# Patient Record
Sex: Female | Born: 1955 | Race: Black or African American | Hispanic: No | Smoking: Current every day smoker
Health system: Southern US, Community
[De-identification: ages and names within clinical notes are randomized; demographics above are authoritative.]

## PROBLEM LIST (undated history)

## (undated) DIAGNOSIS — K219 Gastro-esophageal reflux disease without esophagitis: Secondary | ICD-10-CM

## (undated) DIAGNOSIS — I10 Essential (primary) hypertension: Secondary | ICD-10-CM

## (undated) DIAGNOSIS — R519 Headache, unspecified: Secondary | ICD-10-CM

## (undated) DIAGNOSIS — F419 Anxiety disorder, unspecified: Secondary | ICD-10-CM

## (undated) DIAGNOSIS — M1990 Unspecified osteoarthritis, unspecified site: Secondary | ICD-10-CM

## (undated) DIAGNOSIS — R7303 Prediabetes: Secondary | ICD-10-CM

## (undated) HISTORY — PX: CERVICAL FUSION: SHX112

## (undated) HISTORY — PX: ABDOMINAL HYSTERECTOMY: SHX81

## (undated) HISTORY — PX: HEMORROIDECTOMY: SUR656

## (undated) HISTORY — PX: BACK SURGERY: SHX140

## (undated) HISTORY — PX: OTHER SURGICAL HISTORY: SHX169

---

## 2003-08-04 ENCOUNTER — Encounter: Admission: RE | Admit: 2003-08-04 | Discharge: 2003-08-04 | Payer: Self-pay | Admitting: Family Medicine

## 2003-08-13 ENCOUNTER — Encounter: Admission: RE | Admit: 2003-08-13 | Discharge: 2003-09-25 | Payer: Self-pay | Admitting: Family Medicine

## 2005-02-10 ENCOUNTER — Ambulatory Visit (HOSPITAL_COMMUNITY): Admission: RE | Admit: 2005-02-10 | Discharge: 2005-02-10 | Payer: Self-pay | Admitting: Gastroenterology

## 2007-01-17 ENCOUNTER — Inpatient Hospital Stay (HOSPITAL_COMMUNITY): Admission: RE | Admit: 2007-01-17 | Discharge: 2007-01-20 | Payer: Self-pay | Admitting: Neurosurgery

## 2007-01-22 ENCOUNTER — Emergency Department (HOSPITAL_COMMUNITY): Admission: EM | Admit: 2007-01-22 | Discharge: 2007-01-22 | Payer: Self-pay | Admitting: Emergency Medicine

## 2009-03-21 ENCOUNTER — Encounter
Admission: RE | Admit: 2009-03-21 | Discharge: 2009-03-21 | Payer: Self-pay | Admitting: Physical Medicine and Rehabilitation

## 2010-02-11 ENCOUNTER — Inpatient Hospital Stay (HOSPITAL_COMMUNITY): Admission: RE | Admit: 2010-02-11 | Discharge: 2010-02-12 | Payer: Self-pay | Admitting: Neurosurgery

## 2010-03-13 ENCOUNTER — Encounter: Admission: RE | Admit: 2010-03-13 | Discharge: 2010-03-13 | Payer: Self-pay | Admitting: Neurosurgery

## 2010-04-10 ENCOUNTER — Encounter: Admission: RE | Admit: 2010-04-10 | Discharge: 2010-04-10 | Payer: Self-pay | Admitting: Neurosurgery

## 2010-05-12 ENCOUNTER — Inpatient Hospital Stay (HOSPITAL_COMMUNITY): Admission: RE | Admit: 2010-05-12 | Discharge: 2010-05-13 | Payer: Self-pay | Admitting: Neurosurgery

## 2010-06-18 ENCOUNTER — Encounter: Admission: RE | Admit: 2010-06-18 | Discharge: 2010-06-18 | Payer: Self-pay | Admitting: Neurosurgery

## 2010-08-27 ENCOUNTER — Ambulatory Visit
Admission: RE | Admit: 2010-08-27 | Discharge: 2010-08-27 | Disposition: A | Discharge status: Home / Self Care | Payer: Medicare Other | Source: Ambulatory Visit | Attending: Neurosurgery | Admitting: Neurosurgery

## 2010-08-27 ENCOUNTER — Other Ambulatory Visit: Payer: Self-pay | Admitting: Neurosurgery

## 2010-08-27 DIAGNOSIS — M545 Low back pain: Secondary | ICD-10-CM

## 2010-10-01 LAB — SURGICAL PCR SCREEN
MRSA, PCR: NEGATIVE
Staphylococcus aureus: NEGATIVE

## 2010-10-01 LAB — BASIC METABOLIC PANEL
Calcium: 10 mg/dL (ref 8.4–10.5)
Chloride: 101 mEq/L (ref 96–112)
Creatinine, Ser: 0.83 mg/dL (ref 0.4–1.2)
GFR calc Af Amer: >60 mL/min (ref >60)

## 2010-10-01 LAB — GLUCOSE, CAPILLARY
Glucose-Capillary: 137 mg/dL — ABNORMAL HIGH (ref 70–99)
Glucose-Capillary: 137 mg/dL — ABNORMAL HIGH (ref 70–99)
Glucose-Capillary: 150 mg/dL — ABNORMAL HIGH (ref 70–99)

## 2010-10-01 LAB — DIFFERENTIAL
Lymphocytes Relative: 28 % (ref 12–46)
Lymphs Abs: 2.3 10*3/uL (ref 0.7–4.0)
Neutrophils Relative %: 63 % (ref 43–77)

## 2010-10-01 LAB — CBC
Platelets: 263 10*3/uL (ref 150–400)
RBC: 4.53 MIL/uL (ref 3.87–5.11)
WBC: 8.2 10*3/uL (ref 4.0–10.5)

## 2010-10-01 LAB — TYPE AND SCREEN: Antibody Screen: NEGATIVE

## 2010-10-04 LAB — GLUCOSE, CAPILLARY
Glucose-Capillary: 106 mg/dL — ABNORMAL HIGH (ref 70–99)
Glucose-Capillary: 131 mg/dL — ABNORMAL HIGH (ref 70–99)
Glucose-Capillary: 193 mg/dL — ABNORMAL HIGH (ref 70–99)

## 2010-10-04 LAB — DIFFERENTIAL
Basophils Absolute: 0.1 10*3/uL (ref 0.0–0.1)
Basophils Relative: 1 % (ref 0–1)
Monocytes Absolute: 0.5 10*3/uL (ref 0.1–1.0)
Neutro Abs: 6.2 10*3/uL (ref 1.7–7.7)

## 2010-10-04 LAB — BASIC METABOLIC PANEL
BUN: 13 mg/dL (ref 6–23)
Calcium: 9.6 mg/dL (ref 8.4–10.5)
GFR calc non Af Amer: 59 mL/min — ABNORMAL LOW (ref >60)
Glucose, Bld: 124 mg/dL — ABNORMAL HIGH (ref 70–99)
Potassium: 3.4 mEq/L — ABNORMAL LOW (ref 3.5–5.1)

## 2010-10-04 LAB — CBC
HCT: 39.1 % (ref 36.0–46.0)
MCHC: 33.8 g/dL (ref 30.0–36.0)
RDW: 14.7 % (ref 11.5–15.5)

## 2010-12-02 NOTE — Op Note (Signed)
[name], [name]             ACCOUNT NO.:  [id number]   MEDICAL RECORD NO.:  [id number]          PATIENT TYPE:  INP   LOCATION:  2899                         FACILITY:  [facility]   PHYSICIAN:  Reinaldo Meeker, M.D. DATE OF BIRTH:  [birth year]   DATE OF PROCEDURE:  01/17/2007  DATE OF DISCHARGE:                               OPERATIVE REPORT   PREOPERATIVE DIAGNOSIS:  Spondylolisthesis with stenosis L5-S1.   POSTOPERATIVE DIAGNOSIS:  Spondylolisthesis with stenosis L5-S1.   PROCEDURE:  L5-S1 decompressive laminectomy followed by bilateral L5-S1  microdiskectomy followed by L5-S1 posterior lumbar interbody fusion with  invasive interbody spacers, followed by nonsegmental instrumentation  with pedicle screw fixation L5-S1, followed by L5-S1 posterolateral  fusion.   SECONDARY PROCEDURE:  Microdissection, L5-S1 disk and L5-S1 nerve roots.   SURGEON:  Reinaldo Meeker, M.D.   ASSISTANT:  Kathaleen Maser. Pool, M.D.   PROCEDURE IN DETAIL:  After being placed in the prone position, the  patient's back was prepped and draped in the usual sterile fashion.  Localizing x-rays were taken prior to incision to identify the  appropriate level.   Midline incision was made over the spinous process of L4, L5, and S1.  Using Bovie cutting current, the incision was carried down the spinous  processes.  Subperiosteal dissection was then carried out bilaterally on  the spinous processes, lamina, and facet joint and into the far lateral  region to identify the transverse process of L5 and the lateral aspects  of the sacrum bilaterally.  Self-retaining retractor was placed for  exposure.  X-rays showed approach at the appropriate level.  Using the  Stille rongeur, the spinous processes and interspinous ligament were  removed.  The spinous process bone was saved for use later in the case.  Starting on the patient's left side, generous laminotomy was performed  by removing the inferior three-quarters of  the L5 lamina, the medial  three-quarters of facet joint, the superior one-half of the S1 segment.  Residual bone was removed and saved for use later in the case.  Ligament  flavum was removed in a piecemeal fashion.  Similar decompression was  carried out on the opposite side.  When this was completed, residual  midline structures were removed to complete the bilateral decompression.  L5 and S1 nerve roots were both identified bilaterally and tracked out  to their foramen generously.  At this time, bilateral microdiskectomy  was carried out using pituitary rongeurs and curettes.  Thorough disk  space clean-out was carried out, and at the same time, great care was  taken to avoid injury to the neural elements.  This was successfully  done.  At this point, inspection was carried out in all directions for  any evidence of residual compression, and none could be identified.  The  disk space was then prepared for posterior lumber interbody fusion.  Distraction up to 10 mm size was carried out.  Rotating cutter followed  by box chisel was carried out, and a 10 x 9 x 20-mm bony spacer was  placed on the first side.  A similar procedure was carried out on  the  opposite side.  Prior to placement of the second bony spacer, autologous  bone, BMP, and __________ were placed in the midline.  Pedicle screw  instrumentation was then placed bilaterally at L5-S1.  Under  fluoroscopic guidance, a drill hole was placed followed by passing the  pedicle awl, tapping, and placing 40-mm screws at L5 and 35-mm screws at  S1.  These were found to be in good position under fluoroscopy.  Large  amounts of irrigation were then carried out.  Any bleeding controlled  with bipolar coagulation and Gelfoam.  The far lateral region was then  decorticated, and posterolateral fusion was performed by placing a  mixture of __________  and autologous bone with BMP.  At this time, an  appropriate length rod was chosen.  S1  screws were secured with their  top-loading nuts bilaterally, and then the L5 screws were secured with  compression of L5 down to S1 bilaterally.  Final fluoroscopy in AP and  lateral direction showed the interbody spacers, screws, and rods to all  be in good position.  Any bleeding on the dura was then controlled with  bipolar coagulation and Gelfoam.  An epidural drain was left in the  epidural space and brought out through a separate incision.  The would  was then closed in multiple layers of Vicryl in the muscle fascia,  subcutaneous and subcuticular tissues, and staples were placed on the  skin.  A sterile dressing was then applied.   The patient was extubated and taken to the recovery room in stable  condition.           ______________________________  Reinaldo Meeker, M.D.     ROK/MEDQ  D:  01/17/2007  T:  01/17/2007  Job:  [id number]

## 2010-12-05 NOTE — Discharge Summary (Signed)
NAME:  Susan [name], Susan [name]             ACCOUNT NO.:  [id number]   MEDICAL RECORD NO.:  [id number]          PATIENT TYPE:  EMS   LOCATION:  MAJO                         FACILITY:  [facility]   PHYSICIAN:  Reinaldo Meeker, M.D. DATE OF BIRTH:  1955-12-11   DATE OF ADMISSION:  01/22/2007  DATE OF DISCHARGE:  01/22/2007                               DISCHARGE SUMMARY   PRIMARY DIAGNOSIS:  Spondylolisthesis with spondylolysis L5-S1.   FINAL DIAGNOSIS:  Spondylolisthesis with spondylolysis L5-S1.   PRIMARY OPERATIVE PROCEDURE:  L5-S1 (PLIF) posterior lumbar interbody  fusion with pedicle screw fixation.   HISTORY:  Susan [name] is a 55 year old female with back and bilateral  lower extremity pain.  She was evaluated with a variety of studies,  which confirmed the presence of spondylolysis of L5 with  spondylolisthesis of L5-S1.  After discussing the options, she wished to  proceed with surgery, and was admitted at this time for posterior lumbar  interbody fusion with pedicle screw fixation.  On January 17, 2007, the  patient was taken to the operating room, where she underwent the above-  mentioned procedure.  She tolerated it well.  Over subsequent days, she  was able to increase her activities, though she did have some diffuse  body discomfort.  By January 20, 2007, however, she was up, ambulating well,  tolerating a regular diet.  Her wound was healing well, her pain was  markedly improved.  It was felt that she could be discharged home.   DISCHARGE MEDICATIONS:  Pain medication.   CONDITION ON DISCHARGE:  Markedly improved versus admission.           ______________________________  Reinaldo Meeker, M.D.     ROK/MEDQ  D:  02/17/2007  T:  02/17/2007  Job:  [id number]

## 2011-05-06 LAB — BASIC METABOLIC PANEL
CO2: 29
Calcium: 9.4
Creatinine, Ser: 0.81
GFR calc Af Amer: >60
GFR calc non Af Amer: >60
Sodium: 138

## 2011-05-06 LAB — TYPE AND SCREEN
ABO/RH(D): A POS
Antibody Screen: NEGATIVE

## 2011-05-06 LAB — CBC
Hemoglobin: 14.1
RBC: 4.66
WBC: 8.8

## 2011-05-06 LAB — ABO/RH: ABO/RH(D): A POS

## 2012-07-11 IMAGING — CT CT L SPINE W/ CM
4 of 11 series · 10 of 33 positions shown, 12 images · IV contrast (omnipaque)
Comparison: No prior cross-sectional [HOSPITAL] this location.

MYELOGRAM INJECTION
TECHNIQUE: Informed consent was obtained from the patient prior to
the procedure, including potential complications of headache,
allergy, infection and pain. Specific instructions were given
regarding 24 hour bedrest post procedure to prevent post-LP
headache.  A timeout procedure was performed.  With the patient
prone, the lower back was prepped with Betadine.  1% Lidocaine was
used for local anesthesia.  Lumbar puncture was performed by the
radiologist at the L4 level using a 22 gauge needle with return of
clear CSF.  15 cc of Omnipaque 180 was injected into the
subarachnoid space .
CLINICAL DATA: Low back pain.  Left leg pain.  Previous L5-S1
discectomy, posterior decompression, posterolateral interbody
fusion augmented with pedicle screw fixation on 01/17/2007.
TECHNIQUE: Multidetector CT imaging of the lumbar spine was
performed following myelography.  Multiplanar CT image
reconstructions were also generated.

[Series 2: l spine bone · axial · 0.27mm/px · z∈[-248,-178]mm · 2 of 86 slices shown, 3 images]
[im 29/86  soft-tissue]
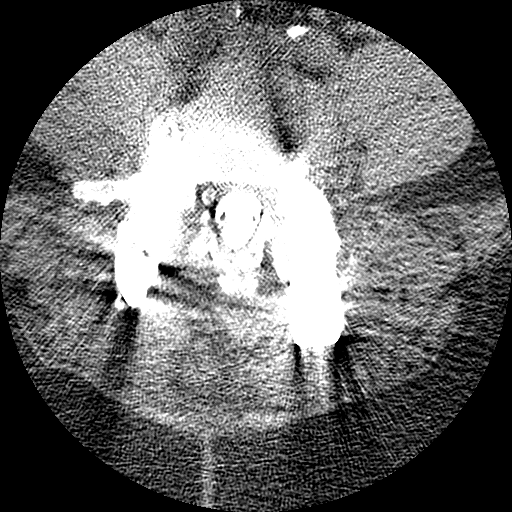
[im 29/86  bone]
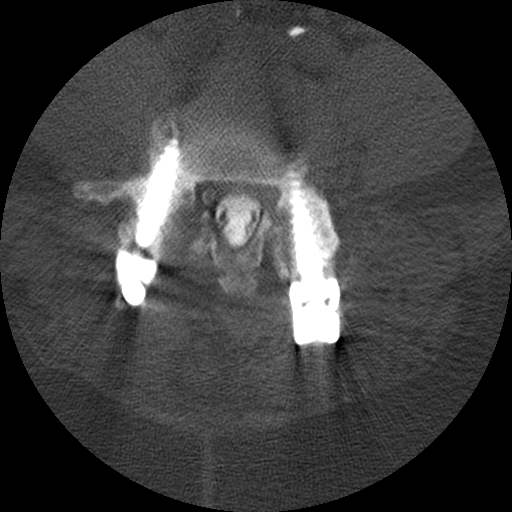
[im 57/86  bone]
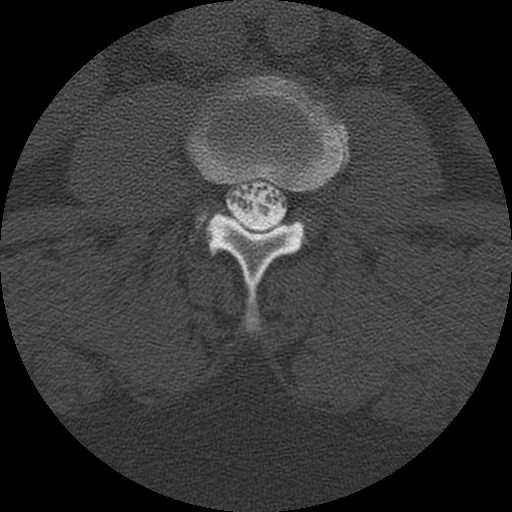

[Series 3: l spine soft · axial · 0.27mm/px · z∈[-248,-178]mm · 2 of 86 slices shown]
[im 29/86  soft-tissue]
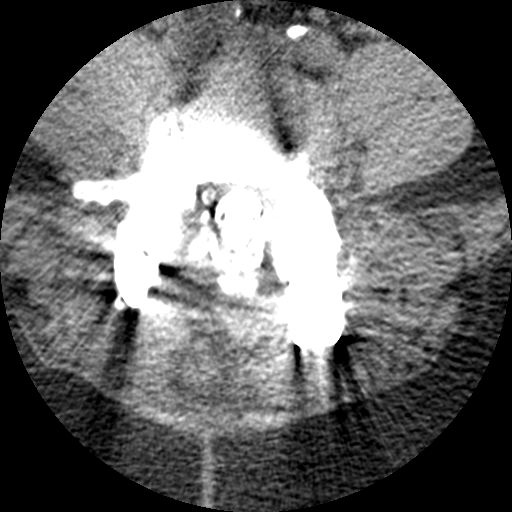
[im 57/86  soft-tissue]
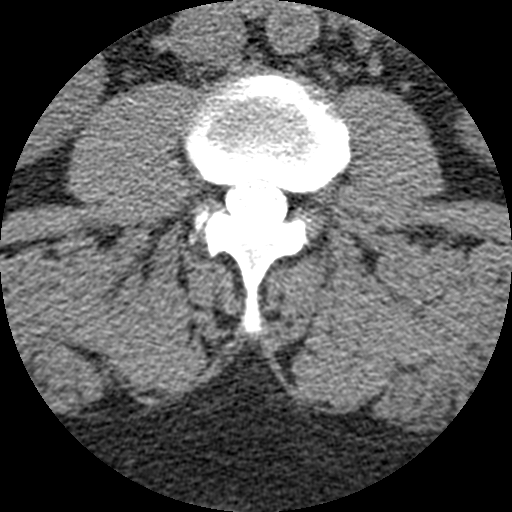

[Series 401: cor lower l-spine · coronal · 0.42mm/px · 1 of 43 slices shown]
[im 22/43  bone]
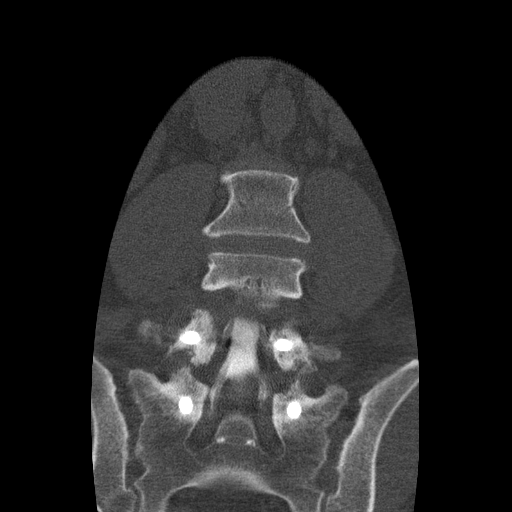

[Series 402: sag · sagittal · 0.42mm/px · 5 of 43 slices shown, 6 images]
[im 15/43  bone]
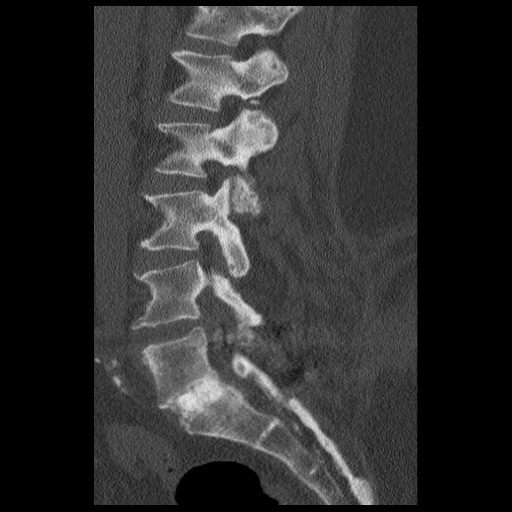
[im 18/43  bone]
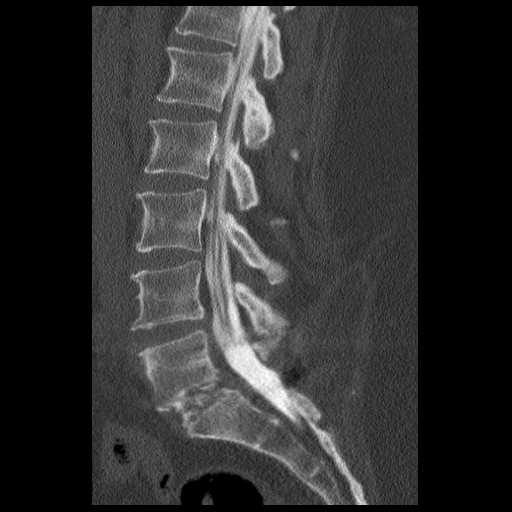
[im 22/43  soft-tissue]
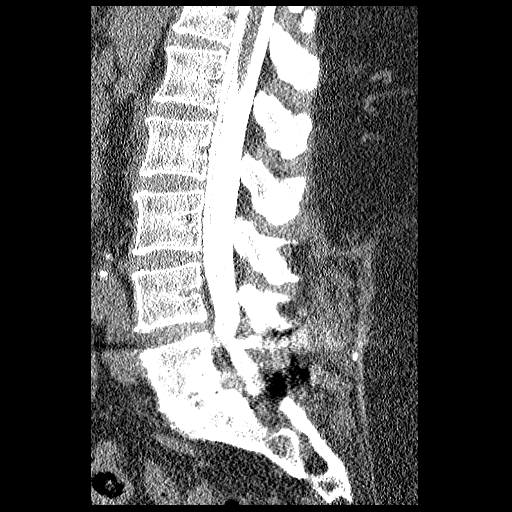
[im 22/43  bone]
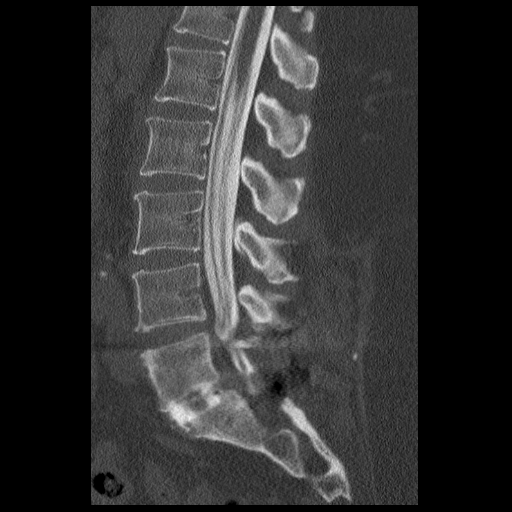
[im 25/43  bone]
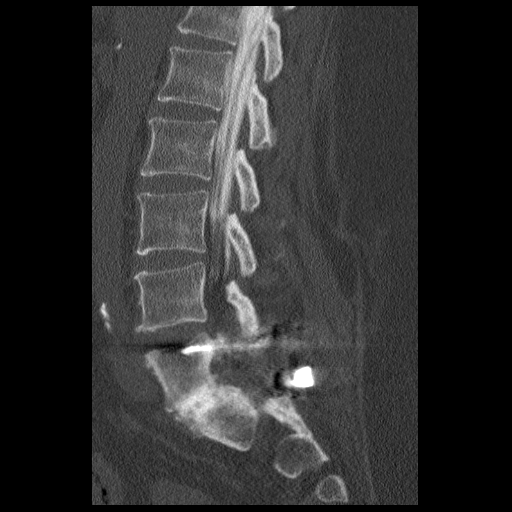
[im 29/43  bone]
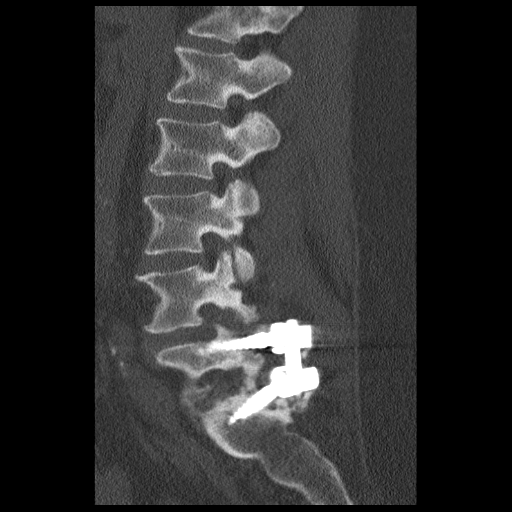

[10 of 33 positions shown; findings below may reference images not displayed]

IMPRESSION: Successful injection of  intrathecal contrast for myelography.

MYELOGRAM LUMBAR
FINDINGS: Good opacification lumbar subarachnoid space.  Hardware
intact and appropriately placed.  Previous L5-S1 fusion appears
solid.  L5 has been fused in a position of 4 mm anterolisthesis
forward on S1.  There is no residual canal stenosis or nerve root
encroachment.

At L4-L5, there is marked adjacent segment disease.  There is
severe narrowing of the thecal sac which is worse with standing.
Bilateral L5 nerve root encroachment is present which may be
slightly worse on the left.  With the patient recumbent for
myelography, there is no slip.  Erect films demonstrate 4 mm
anterolisthesis in extension, 6 mm in neutral, and 7 mm in flexion.

Fluoroscopy Time: 1.20 minutes
IMPRESSION: As above

CT MYELOGRAPHY LUMBAR SPINE
FINDINGS: No prevertebral or paraspinous masses.  Mild
nonaneurysmal atherosclerotic calcification.

L1-2: Normal.

L2-3: Mild facet arthropathy.  Mild bulge.  No compressive lesion.

L3-4: Mild facet arthropathy.  No compressive lesion.  No disc
pathology.

L4-5: Severe facet arthrosis with bony overgrowth, and marked
gaping of the facet joints.  Ligamentum flavum hypertrophy is
present.  There is mild annular bulging but no frank disc
protrusion.  There is no subluxation with the patient recumbent for
CT.  There is only mild effacement of the L5 nerve roots in the
canal.  There is bilateral neural foraminal narrowing due to bony
overgrowth and ligamentum flavum hypertrophy, worse on the left,
which causes left greater than right L4 nerve root compression.

L5-S1: Solid interbody and posterolateral fusion.  Hardware intact
and appropriately placed.  No S1 or L5 nerve root encroachment.  No
residual stenosis.
IMPRESSION: Solid L5-S1 fusion.  No adverse features.

Severe adjacent segment disease at L4-5 with advanced posterior
element hypertrophy affecting facets and ligamentum flava,  dynamic
instability secondary to  facet mediated slip, maximal with
standing flexion measuring 7 mm., annular bulging and bilateral
foraminal narrowing. Bilateral L5 and bilateral L4 nerve root
encroachment are present.  See comments above.

## 2019-07-21 DIAGNOSIS — J189 Pneumonia, unspecified organism: Secondary | ICD-10-CM

## 2019-07-21 HISTORY — DX: Pneumonia, unspecified organism: J18.9

## 2020-12-06 ENCOUNTER — Other Ambulatory Visit: Payer: Self-pay | Admitting: Neurosurgery

## 2020-12-13 ENCOUNTER — Other Ambulatory Visit: Payer: Self-pay | Admitting: Neurosurgery

## 2020-12-31 NOTE — Pre-Procedure Instructions (Signed)
Surgical Instructions    Your procedure is scheduled on Friday, June 17th..  Report to [facility] Main Entrance "A" at 06:00 A.M., then check in with the Admitting office.  Call this number if you have problems the morning of surgery:  [phone]   If you have any questions prior to your surgery date call [phone]: Open Monday-Friday 8am-4pm    Remember:  Do not eat or drink after midnight the night before your surgery.     Take these medicines the morning of surgery with A SIP OF WATER: atorvastatin (LIPITOR) gabapentin (NEURONTIN) sertraline (ZOLOFT) topiramate (TOPAMAX)  If needed:  acetaminophen (TYLENOL)  fexofenadine (ALLEGRA)  tiZANidine (ZANAFLEX)  Naphazoline HCl (CLEAR EYES OP) eye drops   As of today, STOP taking any Aspirin (unless otherwise instructed by your surgeon), meloxicam (MOBIC), Aleve, Naproxen, Ibuprofen, Motrin, Advil, Goody's, BC's, all herbal medications, fish oil, and all vitamins.            Do not wear jewelry or makeup. Do not wear lotions, powders, perfumes, or deodorant. Do not shave 48 hours prior to surgery.   Do not bring valuables to the hospital. DO Not wear nail polish, gel polish, artificial nails, or any other type of covering on natural nails including finger and toenails. If patients have artificial nails, gel coating, etc. that need to be removed by a nail salon please have this removed prior to surgery or surgery may need to be canceled/delayed if the surgeon/ anesthesia feels like the patient is unable to be adequately monitored.             [place] is not responsible for any belongings or valuables.  Do NOT Smoke (Tobacco/Vaping) or drink Alcohol 24 hours prior to your procedure If you use a CPAP at night, you may bring all equipment for your overnight stay.   Contacts, glasses, dentures or bridgework may not be worn into surgery, please bring cases for these belongings   For patients admitted to the hospital,  discharge time will be determined by your treatment team.   Patients discharged the day of surgery will not be allowed to drive home, and someone needs to stay with them for 24 hours.  ONLY 1 SUPPORT PERSON MAY BE PRESENT WHILE YOU ARE IN SURGERY. IF YOU ARE TO BE ADMITTED ONCE YOU ARE IN YOUR ROOM YOU WILL BE ALLOWED TWO (2) VISITORS.  Minor children may have two parents present. Special consideration for safety and communication needs will be reviewed on a case by case basis.  Special instructions:    Oral Hygiene is also important to reduce your risk of infection.  Remember - BRUSH YOUR TEETH THE MORNING OF SURGERY WITH YOUR REGULAR TOOTHPASTE   [place]- Preparing For Surgery  Before surgery, you can play an important role. Because skin is not sterile, your skin needs to be as free of germs as possible. You can reduce the number of germs on your skin by washing with CHG (chlorahexidine gluconate) Soap before surgery.  CHG is an antiseptic cleaner which kills germs and bonds with the skin to continue killing germs even after washing.     Please do not use if you have an allergy to CHG or antibacterial soaps. If your skin becomes reddened/irritated stop using the CHG.  Do not shave (including legs and underarms) for at least 48 hours prior to first CHG shower. It is OK to shave your face.  Please follow these instructions carefully.     Shower  the NIGHT BEFORE SURGERY and the MORNING OF SURGERY with CHG Soap.   If you chose to wash your hair, wash your hair first as usual with your normal shampoo. After you shampoo, rinse your hair and body thoroughly to remove the shampoo.  Then [employer] and genitals (private parts) with your normal soap and rinse thoroughly to remove soap.  After that Use CHG Soap as you would any other liquid soap. You can apply CHG directly to the skin and wash gently with a scrungie or a clean washcloth.   Apply the CHG Soap to your body ONLY FROM THE NECK DOWN.   Do not use on open wounds or open sores. Avoid contact with your eyes, ears, mouth and genitals (private parts). Wash Face and genitals (private parts)  with your normal soap.   Wash thoroughly, paying special attention to the area where your surgery will be performed.  Thoroughly rinse your body with warm water from the neck down.  DO NOT shower/wash with your normal soap after using and rinsing off the CHG Soap.  Pat yourself dry with a CLEAN TOWEL.  Wear CLEAN PAJAMAS to bed the night before surgery  Place CLEAN SHEETS on your bed the night before your surgery  DO NOT SLEEP WITH PETS.   Day of Surgery:  Take a shower with CHG soap. Wear Clean/Comfortable clothing the morning of surgery Do not apply any deodorants/lotions.   Remember to brush your teeth WITH YOUR REGULAR TOOTHPASTE.   Please read over the following fact sheets that you were given.

## 2021-01-01 ENCOUNTER — Encounter (HOSPITAL_COMMUNITY)
Admission: RE | Admit: 2021-01-01 | Discharge: 2021-01-01 | Disposition: A | Discharge status: Home / Self Care | Payer: Medicare HMO | Source: Ambulatory Visit | Attending: Neurosurgery | Admitting: Neurosurgery

## 2021-01-01 ENCOUNTER — Other Ambulatory Visit: Payer: Self-pay

## 2021-01-01 ENCOUNTER — Encounter (HOSPITAL_COMMUNITY): Payer: Self-pay

## 2021-01-01 DIAGNOSIS — Z20822 Contact with and (suspected) exposure to covid-19: Secondary | ICD-10-CM | POA: Insufficient documentation

## 2021-01-01 DIAGNOSIS — Z01818 Encounter for other preprocedural examination: Secondary | ICD-10-CM | POA: Insufficient documentation

## 2021-01-01 HISTORY — DX: Gastro-esophageal reflux disease without esophagitis: K21.9

## 2021-01-01 HISTORY — DX: Headache, unspecified: R51.9

## 2021-01-01 HISTORY — DX: Anxiety disorder, unspecified: F41.9

## 2021-01-01 HISTORY — DX: Essential (primary) hypertension: I10

## 2021-01-01 HISTORY — DX: Prediabetes: R73.03

## 2021-01-01 HISTORY — DX: Unspecified osteoarthritis, unspecified site: M19.90

## 2021-01-01 LAB — CBC WITH DIFFERENTIAL/PLATELET
Abs Immature Granulocytes: 0.02 10*3/uL (ref 0.00–0.07)
Basophils Absolute: 0.1 10*3/uL (ref 0.0–0.1)
Basophils Relative: 1 %
Eosinophils Absolute: 0.1 10*3/uL (ref 0.0–0.5)
Eosinophils Relative: 2 %
HCT: 38.4 % (ref 36.0–46.0)
Hemoglobin: 12 g/dL (ref 12.0–15.0)
Immature Granulocytes: 0 %
Lymphocytes Relative: 26 %
Lymphs Abs: 2.1 10*3/uL (ref 0.7–4.0)
MCH: 29.4 pg (ref 26.0–34.0)
MCHC: 31.3 g/dL (ref 30.0–36.0)
MCV: 94.1 fL (ref 80.0–100.0)
Monocytes Absolute: 0.6 10*3/uL (ref 0.1–1.0)
Monocytes Relative: 7 %
Neutro Abs: 5.1 10*3/uL (ref 1.7–7.7)
Neutrophils Relative %: 64 %
Platelets: 271 10*3/uL (ref 150–400)
RBC: 4.08 MIL/uL (ref 3.87–5.11)
RDW: 15.3 % (ref 11.5–15.5)
WBC: 8 10*3/uL (ref 4.0–10.5)
nRBC: 0 % (ref 0.0–0.2)

## 2021-01-01 LAB — BASIC METABOLIC PANEL
Anion gap: 8 (ref 5–15)
BUN: 10 mg/dL (ref 8–23)
CO2: 28 mmol/L (ref 22–32)
Calcium: 9.2 mg/dL (ref 8.9–10.3)
Chloride: 104 mmol/L (ref 98–111)
Creatinine, Ser: 0.92 mg/dL (ref 0.44–1.00)
GFR, Estimated: >60 mL/min (ref >60)
Glucose, Bld: 117 mg/dL — ABNORMAL HIGH (ref 70–99)
Potassium: 3.2 mmol/L — ABNORMAL LOW (ref 3.5–5.1)
Sodium: 140 mmol/L (ref 135–145)

## 2021-01-01 LAB — SURGICAL PCR SCREEN
MRSA, PCR: NEGATIVE
Staphylococcus aureus: NEGATIVE

## 2021-01-01 LAB — TYPE AND SCREEN
ABO/RH(D): A POS
Antibody Screen: NEGATIVE

## 2021-01-01 LAB — SARS CORONAVIRUS 2 (TAT 6-24 HRS): SARS Coronavirus 2: NEGATIVE

## 2021-01-01 NOTE — Pre-Procedure Instructions (Signed)
Surgical Instructions    Your procedure is scheduled on Friday, June 17th..  Report to [facility] "A" at 06:00 A.M., then check in with the Admitting office.  Call this number if you have problems the morning of surgery:  [phone]   If you have any questions prior to your surgery date call [phone]: Open Monday-Friday 8am-4pm    Remember:  Do not eat or drink after midnight the night before your surgery.     Take these medicines the morning of surgery with A SIP OF WATER: atorvastatin (LIPITOR) gabapentin (NEURONTIN) Sertraline (ZOLOFT) topiramate (TOPAMAX)  If needed:   acetaminophen (TYLENOL)  fexofenadine (ALLEGRA)  tiZANidine (ZANAFLEX)  Naphazoline HCl (CLEAR EYES OP) eye drops   As of today, STOP taking any Aspirin (unless otherwise instructed by your surgeon), meloxicam (MOBIC), Aleve, Naproxen, Ibuprofen, Motrin, Advil, Goody's, BC's, all herbal medications, fish oil, and all vitamins.            Do not wear jewelry or makeup, nail polish Do not wear lotions, powders, perfumes, or deodorant. Do not shave 48 hours prior to surgery.   Do not bring valuables to the hospital.             [facility] is not responsible for any belongings or valuables.  Do NOT Smoke (Tobacco/Vaping) or drink Alcohol 24 hours prior to your procedure If you use a CPAP at night, you may bring all equipment for your overnight stay.   Contacts, glasses, dentures or bridgework may not be worn into surgery, please bring cases for these belongings   For patients admitted to the hospital, discharge time will be determined by your treatment team.   Patients discharged the day of surgery will not be allowed to drive home, and someone needs to stay with them for 24 hours.  ONLY 1 SUPPORT PERSON MAY BE PRESENT WHILE YOU ARE IN SURGERY. IF YOU ARE TO BE ADMITTED ONCE YOU ARE IN YOUR ROOM YOU WILL BE ALLOWED TWO (2) VISITORS.  Minor children may have two parents present.  Special consideration for safety and communication needs will be reviewed on a case by case basis.  Special instructions:    Oral Hygiene is also important to reduce your risk of infection.  Remember - BRUSH YOUR TEETH THE MORNING OF SURGERY WITH YOUR REGULAR TOOTHPASTE   [place]- Preparing For Surgery  Before surgery, you can play an important role. Because skin is not sterile, your skin needs to be as free of germs as possible. You can reduce the number of germs on your skin by washing with CHG (chlorahexidine gluconate) Soap before surgery.  CHG is an antiseptic cleaner which kills germs and bonds with the skin to continue killing germs even after washing.     Please do not use if you have an allergy to CHG or antibacterial soaps. If your skin becomes reddened/irritated stop using the CHG.  Do not shave (including legs and underarms) for at least 48 hours prior to first CHG shower. It is OK to shave your face.  Please follow these instructions carefully.     Shower the NIGHT BEFORE SURGERY and the MORNING OF SURGERY with CHG Soap.   If you chose to wash your hair, wash your hair first as usual with your normal shampoo. After you shampoo, rinse your hair and body thoroughly to remove the shampoo.  Then [employer] and genitals (private parts) with your normal soap and rinse thoroughly to remove soap.  After  that Use CHG Soap as you would any other liquid soap. You can apply CHG directly to the skin and wash gently with a scrungie or a clean washcloth.   Apply the CHG Soap to your body ONLY FROM THE NECK DOWN.  Do not use on open wounds or open sores. Avoid contact with your eyes, ears, mouth and genitals (private parts). Wash Face and genitals (private parts)  with your normal soap.   Wash thoroughly, paying special attention to the area where your surgery will be performed.  Thoroughly rinse your body with warm water from the neck down.  DO NOT shower/wash with your normal soap  after using and rinsing off the CHG Soap.  Pat yourself dry with a CLEAN TOWEL.  Wear CLEAN PAJAMAS to bed the night before surgery  Place CLEAN SHEETS on your bed the night before your surgery  DO NOT SLEEP WITH PETS.   Day of Surgery:  Take a shower with CHG soap. Wear Clean/Comfortable clothing the morning of surgery Do not apply any deodorants/lotions.   Remember to brush your teeth WITH YOUR REGULAR TOOTHPASTE.   Please read over the following fact sheets that you were given.

## 2021-01-01 NOTE — Progress Notes (Addendum)
PCP - [facility]. Health) Cardiologist - denies  Chest x-ray - n/a EKG - 01-01-21  SA - yes, does not wear CPAP  Pre-diabetic, no longer takes medication, checks CBGs randomly  COVID TEST- 01-01-21   Anesthesia review: n/a Susan Poles, PA notified of abnormal EKG. No need for anesthesia review.  Patient denies shortness of breath, fever, cough and chest pain at PAT appointment   All instructions explained to the patient, with a verbal understanding of the material. Patient agrees to go over the instructions while at home for a better understanding. Patient also instructed to self quarantine after being tested for COVID-19. The opportunity to ask questions was provided.

## 2021-01-02 LAB — HEMOGLOBIN A1C
Hgb A1c MFr Bld: 5.7 % — ABNORMAL HIGH (ref 4.8–5.6)
Mean Plasma Glucose: 117 mg/dL

## 2021-01-03 ENCOUNTER — Encounter (HOSPITAL_COMMUNITY): Payer: Self-pay | Admitting: Neurosurgery

## 2021-01-03 ENCOUNTER — Inpatient Hospital Stay (HOSPITAL_COMMUNITY): Payer: Medicare HMO

## 2021-01-03 ENCOUNTER — Inpatient Hospital Stay (HOSPITAL_COMMUNITY): Payer: Medicare HMO | Admitting: Anesthesiology

## 2021-01-03 ENCOUNTER — Inpatient Hospital Stay (HOSPITAL_COMMUNITY)
Admission: RE | Admit: 2021-01-03 | Discharge: 2021-01-04 | DRG: 473 | Disposition: A | Discharge status: Home / Self Care | Payer: Medicare HMO | Attending: Neurosurgery | Admitting: Neurosurgery

## 2021-01-03 ENCOUNTER — Encounter (HOSPITAL_COMMUNITY)
Admission: RE | Disposition: A | Discharge status: Home / Self Care | Payer: Self-pay | Source: Home / Self Care | Attending: Neurosurgery

## 2021-01-03 ENCOUNTER — Other Ambulatory Visit: Payer: Self-pay

## 2021-01-03 ENCOUNTER — Inpatient Hospital Stay (HOSPITAL_COMMUNITY): Payer: Medicare HMO | Admitting: Physician Assistant

## 2021-01-03 DIAGNOSIS — Z885 Allergy status to narcotic agent status: Secondary | ICD-10-CM

## 2021-01-03 DIAGNOSIS — M960 Pseudarthrosis after fusion or arthrodesis: Principal | ICD-10-CM | POA: Diagnosis present

## 2021-01-03 DIAGNOSIS — Z881 Allergy status to other antibiotic agents status: Secondary | ICD-10-CM

## 2021-01-03 DIAGNOSIS — Z791 Long term (current) use of non-steroidal anti-inflammatories (NSAID): Secondary | ICD-10-CM

## 2021-01-03 DIAGNOSIS — F17210 Nicotine dependence, cigarettes, uncomplicated: Secondary | ICD-10-CM | POA: Diagnosis present

## 2021-01-03 DIAGNOSIS — Z79899 Other long term (current) drug therapy: Secondary | ICD-10-CM | POA: Diagnosis not present

## 2021-01-03 DIAGNOSIS — I10 Essential (primary) hypertension: Secondary | ICD-10-CM | POA: Diagnosis present

## 2021-01-03 DIAGNOSIS — Z20822 Contact with and (suspected) exposure to covid-19: Secondary | ICD-10-CM | POA: Diagnosis present

## 2021-01-03 DIAGNOSIS — Y831 Surgical operation with implant of artificial internal device as the cause of abnormal reaction of the patient, or of later complication, without mention of misadventure at the time of the procedure: Secondary | ICD-10-CM | POA: Diagnosis present

## 2021-01-03 DIAGNOSIS — F419 Anxiety disorder, unspecified: Secondary | ICD-10-CM | POA: Diagnosis present

## 2021-01-03 DIAGNOSIS — M5412 Radiculopathy, cervical region: Secondary | ICD-10-CM | POA: Diagnosis present

## 2021-01-03 DIAGNOSIS — Z419 Encounter for procedure for purposes other than remedying health state, unspecified: Secondary | ICD-10-CM

## 2021-01-03 DIAGNOSIS — S129XXA Fracture of neck, unspecified, initial encounter: Secondary | ICD-10-CM | POA: Diagnosis present

## 2021-01-03 HISTORY — PX: POSTERIOR CERVICAL FUSION/FORAMINOTOMY: SHX5038

## 2021-01-03 LAB — GLUCOSE, CAPILLARY
Glucose-Capillary: 104 mg/dL — ABNORMAL HIGH (ref 70–99)
Glucose-Capillary: 151 mg/dL — ABNORMAL HIGH (ref 70–99)

## 2021-01-03 SURGERY — POSTERIOR CERVICAL FUSION/FORAMINOTOMY LEVEL 2
Anesthesia: General | Site: Spine Cervical

## 2021-01-03 MED ORDER — TEMAZEPAM 15 MG PO CAPS: 30.0000 mg | ORAL_CAPSULE | Freq: Every evening | ORAL | Status: DC | PRN

## 2021-01-03 MED ORDER — SUCCINYLCHOLINE CHLORIDE 200 MG/10ML IV SOSY
PREFILLED_SYRINGE | INTRAVENOUS | Status: AC
  Filled 2021-01-03: qty 10

## 2021-01-03 MED ORDER — FENTANYL CITRATE (PF) 250 MCG/5ML IJ SOLN
INTRAMUSCULAR | Status: AC
  Filled 2021-01-03: qty 5

## 2021-01-03 MED ORDER — ONDANSETRON HCL 4 MG/2ML IJ SOLN: 4.0000 mg | Freq: Four times a day (QID) | INTRAMUSCULAR | Status: DC | PRN

## 2021-01-03 MED ORDER — ACETAMINOPHEN 325 MG PO TABS: 650.0000 mg | ORAL_TABLET | ORAL | Status: DC | PRN

## 2021-01-03 MED ORDER — ACETAMINOPHEN 650 MG RE SUPP: 650.0000 mg | RECTAL | Status: DC | PRN

## 2021-01-03 MED ORDER — TOPIRAMATE 25 MG PO TABS
50.0000 mg | ORAL_TABLET | Freq: Two times a day (BID) | ORAL | Status: DC
  Administered 2021-01-03: 50 mg via ORAL
  Filled 2021-01-03: qty 2

## 2021-01-03 MED ORDER — CHLORHEXIDINE GLUCONATE 0.12 % MT SOLN
15.0000 mL | Freq: Once | OROMUCOSAL | Status: AC
  Administered 2021-01-03: 15 mL via OROMUCOSAL

## 2021-01-03 MED ORDER — VANCOMYCIN HCL 1000 MG IV SOLR
INTRAVENOUS | Status: AC
  Filled 2021-01-03: qty 1000

## 2021-01-03 MED ORDER — SERTRALINE HCL 50 MG PO TABS
50.0000 mg | ORAL_TABLET | Freq: Every day | ORAL | Status: DC
  Administered 2021-01-03: 50 mg via ORAL
  Filled 2021-01-03: qty 1

## 2021-01-03 MED ORDER — HYDROMORPHONE HCL 1 MG/ML IJ SOLN
1.0000 mg | INTRAMUSCULAR | Status: DC | PRN
  Administered 2021-01-03: 1 mg via INTRAVENOUS
  Filled 2021-01-03: qty 1

## 2021-01-03 MED ORDER — GABAPENTIN 600 MG PO TABS
600.0000 mg | ORAL_TABLET | Freq: Three times a day (TID) | ORAL | Status: DC
  Administered 2021-01-03 (×2): 600 mg via ORAL
  Filled 2021-01-03 (×2): qty 1

## 2021-01-03 MED ORDER — DEXAMETHASONE SODIUM PHOSPHATE 10 MG/ML IJ SOLN
INTRAMUSCULAR | Status: AC
  Filled 2021-01-03: qty 1

## 2021-01-03 MED ORDER — LIDOCAINE HCL (PF) 2 % IJ SOLN
INTRAMUSCULAR | Status: DC | PRN
  Administered 2021-01-03: 80 mg via INTRADERMAL

## 2021-01-03 MED ORDER — SODIUM CHLORIDE 0.9 % IV SOLN: 250.0000 mL | INTRAVENOUS | Status: DC

## 2021-01-03 MED ORDER — ROCURONIUM BROMIDE 100 MG/10ML IV SOLN
INTRAVENOUS | Status: DC | PRN
  Administered 2021-01-03: 50 mg via INTRAVENOUS

## 2021-01-03 MED ORDER — HYDRALAZINE HCL 20 MG/ML IJ SOLN
10.0000 mg | Freq: Once | INTRAMUSCULAR | Status: AC
  Administered 2021-01-03: 10 mg via INTRAVENOUS

## 2021-01-03 MED ORDER — LORATADINE 10 MG PO TABS: 10.0000 mg | ORAL_TABLET | Freq: Every day | ORAL | Status: DC

## 2021-01-03 MED ORDER — THROMBIN 20000 UNITS EX SOLR
CUTANEOUS | Status: AC
  Filled 2021-01-03: qty 20000

## 2021-01-03 MED ORDER — BACITRACIN ZINC 500 UNIT/GM EX OINT
TOPICAL_OINTMENT | CUTANEOUS | Status: AC
  Filled 2021-01-03: qty 28.35

## 2021-01-03 MED ORDER — ONDANSETRON HCL 4 MG/2ML IJ SOLN
INTRAMUSCULAR | Status: AC
  Filled 2021-01-03: qty 2

## 2021-01-03 MED ORDER — ORAL CARE MOUTH RINSE: 15.0000 mL | Freq: Once | OROMUCOSAL | Status: AC

## 2021-01-03 MED ORDER — SODIUM CHLORIDE 0.9% FLUSH: 3.0000 mL | INTRAVENOUS | Status: DC | PRN

## 2021-01-03 MED ORDER — PHENYLEPHRINE HCL-NACL 10-0.9 MG/250ML-% IV SOLN
INTRAVENOUS | Status: DC | PRN
  Administered 2021-01-03: 25 ug/min via INTRAVENOUS

## 2021-01-03 MED ORDER — FENTANYL CITRATE (PF) 250 MCG/5ML IJ SOLN
INTRAMUSCULAR | Status: DC | PRN
  Administered 2021-01-03 (×4): 50 ug via INTRAVENOUS

## 2021-01-03 MED ORDER — PROPOFOL 10 MG/ML IV BOLUS
INTRAVENOUS | Status: AC
  Filled 2021-01-03: qty 40

## 2021-01-03 MED ORDER — 0.9 % SODIUM CHLORIDE (POUR BTL) OPTIME
TOPICAL | Status: DC | PRN
  Administered 2021-01-03: 1000 mL

## 2021-01-03 MED ORDER — PHENOL 1.4 % MT LIQD: 1.0000 | OROMUCOSAL | Status: DC | PRN

## 2021-01-03 MED ORDER — FENTANYL CITRATE (PF) 100 MCG/2ML IJ SOLN
25.0000 ug | INTRAMUSCULAR | Status: DC | PRN
  Administered 2021-01-03 (×3): 50 ug via INTRAVENOUS

## 2021-01-03 MED ORDER — FENTANYL CITRATE (PF) 100 MCG/2ML IJ SOLN
INTRAMUSCULAR | Status: AC
  Filled 2021-01-03: qty 2

## 2021-01-03 MED ORDER — CEFAZOLIN SODIUM-DEXTROSE 2-4 GM/100ML-% IV SOLN
2.0000 g | INTRAVENOUS | Status: AC
  Administered 2021-01-03: 2 g via INTRAVENOUS

## 2021-01-03 MED ORDER — DEXAMETHASONE SODIUM PHOSPHATE 10 MG/ML IJ SOLN
10.0000 mg | Freq: Once | INTRAMUSCULAR | Status: AC
  Administered 2021-01-03: 10 mg via INTRAVENOUS

## 2021-01-03 MED ORDER — LACTATED RINGERS IV SOLN: INTRAVENOUS | Status: DC

## 2021-01-03 MED ORDER — MENTHOL 3 MG MT LOZG: 1.0000 | LOZENGE | OROMUCOSAL | Status: DC | PRN

## 2021-01-03 MED ORDER — SODIUM CHLORIDE 0.9% FLUSH
3.0000 mL | Freq: Two times a day (BID) | INTRAVENOUS | Status: DC
  Administered 2021-01-03: 3 mL via INTRAVENOUS

## 2021-01-03 MED ORDER — TIZANIDINE HCL 4 MG PO TABS: 4.0000 mg | ORAL_TABLET | Freq: Three times a day (TID) | ORAL | Status: DC | PRN

## 2021-01-03 MED ORDER — CEFAZOLIN SODIUM-DEXTROSE 1-4 GM/50ML-% IV SOLN
1.0000 g | Freq: Three times a day (TID) | INTRAVENOUS | Status: AC
  Administered 2021-01-03 (×2): 1 g via INTRAVENOUS
  Filled 2021-01-03 (×2): qty 50

## 2021-01-03 MED ORDER — VITAMIN B-12 1000 MCG PO TABS
1000.0000 ug | ORAL_TABLET | Freq: Every day | ORAL | Status: DC
  Administered 2021-01-03: 1000 ug via ORAL
  Filled 2021-01-03: qty 1

## 2021-01-03 MED ORDER — ONDANSETRON HCL 4 MG/2ML IJ SOLN: 4.0000 mg | Freq: Once | INTRAMUSCULAR | Status: DC | PRN

## 2021-01-03 MED ORDER — CEFAZOLIN SODIUM-DEXTROSE 2-4 GM/100ML-% IV SOLN
INTRAVENOUS | Status: AC
  Filled 2021-01-03: qty 100

## 2021-01-03 MED ORDER — ONDANSETRON HCL 4 MG/2ML IJ SOLN
INTRAMUSCULAR | Status: DC | PRN
  Administered 2021-01-03: 4 mg via INTRAVENOUS

## 2021-01-03 MED ORDER — HYDROCODONE-ACETAMINOPHEN 10-325 MG PO TABS
2.0000 | ORAL_TABLET | ORAL | Status: DC | PRN
Start: 2021-01-03 — End: 2021-01-04
  Administered 2021-01-03 (×2): 2 via ORAL
  Filled 2021-01-03 (×2): qty 2

## 2021-01-03 MED ORDER — ONDANSETRON HCL 4 MG PO TABS: 4.0000 mg | ORAL_TABLET | Freq: Four times a day (QID) | ORAL | Status: DC | PRN

## 2021-01-03 MED ORDER — CHLORHEXIDINE GLUCONATE CLOTH 2 % EX PADS: 6.0000 | MEDICATED_PAD | Freq: Once | CUTANEOUS | Status: DC

## 2021-01-03 MED ORDER — ROCURONIUM BROMIDE 10 MG/ML (PF) SYRINGE
PREFILLED_SYRINGE | INTRAVENOUS | Status: AC
  Filled 2021-01-03: qty 10

## 2021-01-03 MED ORDER — MIDAZOLAM HCL 5 MG/5ML IJ SOLN
INTRAMUSCULAR | Status: DC | PRN
  Administered 2021-01-03: 2 mg via INTRAVENOUS

## 2021-01-03 MED ORDER — BACITRACIN ZINC 500 UNIT/GM EX OINT
TOPICAL_OINTMENT | CUTANEOUS | Status: DC | PRN
  Administered 2021-01-03: 1 via TOPICAL

## 2021-01-03 MED ORDER — ATORVASTATIN CALCIUM 80 MG PO TABS: 80.0000 mg | ORAL_TABLET | Freq: Every day | ORAL | Status: DC

## 2021-01-03 MED ORDER — PHENYLEPHRINE HCL (PRESSORS) 10 MG/ML IV SOLN
INTRAVENOUS | Status: DC | PRN
  Administered 2021-01-03: 80 ug via INTRAVENOUS

## 2021-01-03 MED ORDER — CYCLOBENZAPRINE HCL 10 MG PO TABS
10.0000 mg | ORAL_TABLET | Freq: Three times a day (TID) | ORAL | Status: DC | PRN
  Administered 2021-01-03 (×2): 10 mg via ORAL
  Filled 2021-01-03 (×2): qty 1

## 2021-01-03 MED ORDER — LIDOCAINE HCL (PF) 2 % IJ SOLN
INTRAMUSCULAR | Status: AC
  Filled 2021-01-03: qty 5

## 2021-01-03 MED ORDER — BUPIVACAINE HCL (PF) 0.25 % IJ SOLN
INTRAMUSCULAR | Status: AC
  Filled 2021-01-03: qty 30

## 2021-01-03 MED ORDER — MIDAZOLAM HCL 2 MG/2ML IJ SOLN
INTRAMUSCULAR | Status: AC
  Filled 2021-01-03: qty 2

## 2021-01-03 MED ORDER — HYDROCODONE-ACETAMINOPHEN 5-325 MG PO TABS
1.0000 | ORAL_TABLET | ORAL | Status: DC | PRN
Start: 2021-01-03 — End: 2021-01-04

## 2021-01-03 MED ORDER — BUPIVACAINE HCL (PF) 0.25 % IJ SOLN
INTRAMUSCULAR | Status: DC | PRN
  Administered 2021-01-03: 20 mL

## 2021-01-03 MED ORDER — SUGAMMADEX SODIUM 200 MG/2ML IV SOLN
INTRAVENOUS | Status: DC | PRN
  Administered 2021-01-03: 200 mg via INTRAVENOUS

## 2021-01-03 MED ORDER — PROPOFOL 10 MG/ML IV BOLUS
INTRAVENOUS | Status: DC | PRN
  Administered 2021-01-03: 120 mg via INTRAVENOUS

## 2021-01-03 MED ORDER — MELOXICAM 7.5 MG PO TABS
15.0000 mg | ORAL_TABLET | Freq: Every day | ORAL | Status: DC
  Administered 2021-01-03: 15 mg via ORAL
  Filled 2021-01-03 (×2): qty 2

## 2021-01-03 MED ORDER — VANCOMYCIN HCL 1000 MG IV SOLR
INTRAVENOUS | Status: DC | PRN
  Administered 2021-01-03: 1000 mg

## 2021-01-03 MED ORDER — CHLORHEXIDINE GLUCONATE 0.12 % MT SOLN
OROMUCOSAL | Status: AC
  Filled 2021-01-03: qty 15

## 2021-01-03 MED ORDER — HYDRALAZINE HCL 20 MG/ML IJ SOLN
INTRAMUSCULAR | Status: AC
  Filled 2021-01-03: qty 1

## 2021-01-03 MED ORDER — LISINOPRIL 20 MG PO TABS: 20.0000 mg | ORAL_TABLET | Freq: Every day | ORAL | Status: DC

## 2021-01-03 MED ORDER — DEXMEDETOMIDINE (PRECEDEX) IN NS 20 MCG/5ML (4 MCG/ML) IV SYRINGE
PREFILLED_SYRINGE | INTRAVENOUS | Status: DC | PRN
  Administered 2021-01-03 (×2): 8 ug via INTRAVENOUS

## 2021-01-03 MED ORDER — THROMBIN 20000 UNITS EX SOLR
OROMUCOSAL | Status: DC | PRN
  Administered 2021-01-03: 20 mL via TOPICAL

## 2021-01-03 SURGICAL SUPPLY — 65 items
ADH SKN CLS APL DERMABOND .7 (GAUZE/BANDAGES/DRESSINGS) ×1
APL SKNCLS STERI-STRIP NONHPOA (GAUZE/BANDAGES/DRESSINGS) ×1
BAG DECANTER FOR FLEXI CONT (MISCELLANEOUS) ×3 IMPLANT
BENZOIN TINCTURE PRP APPL 2/3 (GAUZE/BANDAGES/DRESSINGS) ×3 IMPLANT
BIT DRILL 2.4 (BIT) ×1 IMPLANT
BIT DRILL 2.4MM (BIT) ×1
BLADE CLIPPER SURG (BLADE) IMPLANT
BUR MATCHSTICK NEURO 3.0 LAGG (BURR) ×3 IMPLANT
CANISTER SUCT 3000ML PPV (MISCELLANEOUS) ×3 IMPLANT
CARTRIDGE OIL MAESTRO DRILL (MISCELLANEOUS) ×1 IMPLANT
CLOSURE STERI-STRIP 1/2X4 (GAUZE/BANDAGES/DRESSINGS) ×1
CLOSURE WOUND 1/2 X4 (GAUZE/BANDAGES/DRESSINGS) ×1
CLSR STERI-STRIP ANTIMIC 1/2X4 (GAUZE/BANDAGES/DRESSINGS) ×1 IMPLANT
COVER WAND RF STERILE (DRAPES) ×3 IMPLANT
DERMABOND ADVANCED (GAUZE/BANDAGES/DRESSINGS) ×2
DERMABOND ADVANCED .7 DNX12 (GAUZE/BANDAGES/DRESSINGS) ×1 IMPLANT
DIFFUSER DRILL AIR PNEUMATIC (MISCELLANEOUS) ×3 IMPLANT
DRAPE C-ARM 42X72 X-RAY (DRAPES) ×6 IMPLANT
DRAPE LAPAROTOMY 100X72 PEDS (DRAPES) ×3 IMPLANT
DRSG OPSITE POSTOP 4X6 (GAUZE/BANDAGES/DRESSINGS) ×3 IMPLANT
DURAPREP 26ML APPLICATOR (WOUND CARE) ×3 IMPLANT
ELECT REM PT RETURN 9FT ADLT (ELECTROSURGICAL) ×3
ELECTRODE REM PT RTRN 9FT ADLT (ELECTROSURGICAL) ×1 IMPLANT
EVACUATOR 1/8 PVC DRAIN (DRAIN) IMPLANT
GAUZE 4X4 16PLY RFD (DISPOSABLE) IMPLANT
GAUZE SPONGE 4X4 12PLY STRL (GAUZE/BANDAGES/DRESSINGS) ×3 IMPLANT
GLOVE BIO SURGEON STRL SZ 6.5 (GLOVE) ×5 IMPLANT
GLOVE BIO SURGEONS STRL SZ 6.5 (GLOVE) ×4
GLOVE ECLIPSE 9.0 STRL (GLOVE) ×3 IMPLANT
GLOVE EXAM NITRILE XL STR (GLOVE) IMPLANT
GLOVE SURG UNDER POLY LF SZ6.5 (GLOVE) ×9 IMPLANT
GOWN STRL REUS W/ TWL LRG LVL3 (GOWN DISPOSABLE) IMPLANT
GOWN STRL REUS W/ TWL XL LVL3 (GOWN DISPOSABLE) ×1 IMPLANT
GOWN STRL REUS W/TWL 2XL LVL3 (GOWN DISPOSABLE) IMPLANT
GOWN STRL REUS W/TWL LRG LVL3 (GOWN DISPOSABLE) ×15
GOWN STRL REUS W/TWL XL LVL3 (GOWN DISPOSABLE) ×3
GRAFT BN 5X1XSPNE CVD POST DBM (Bone Implant) IMPLANT
GRAFT BONE MAGNIFUSE 1X5CM (Bone Implant) ×3 IMPLANT
KIT BASIN OR (CUSTOM PROCEDURE TRAY) ×3 IMPLANT
KIT INFUSE X SMALL 1.4CC (Orthopedic Implant) ×2 IMPLANT
KIT TURNOVER KIT B (KITS) ×3 IMPLANT
NDL SPNL 22GX3.5 QUINCKE BK (NEEDLE) ×1 IMPLANT
NEEDLE HYPO 22GX1.5 SAFETY (NEEDLE) ×3 IMPLANT
NEEDLE SPNL 22GX3.5 QUINCKE BK (NEEDLE) ×3 IMPLANT
NS IRRIG 1000ML POUR BTL (IV SOLUTION) ×3 IMPLANT
OIL CARTRIDGE MAESTRO DRILL (MISCELLANEOUS) ×3
PACK LAMINECTOMY NEURO (CUSTOM PROCEDURE TRAY) ×3 IMPLANT
PAD ARMBOARD 7.5X6 YLW CONV (MISCELLANEOUS) ×9 IMPLANT
PIN MAYFIELD SKULL DISP (PIN) ×3 IMPLANT
ROD PRE CUT 3.5X40MM SPINAL (Rod) ×4 IMPLANT
SCREW MULTI AXIAL 3.5X14MM (Screw) ×12 IMPLANT
SET SCREW INFINITY IFIX THOR (Screw) ×12 IMPLANT
SPONGE LAP 4X18 RFD (DISPOSABLE) IMPLANT
SPONGE SURGIFOAM ABS GEL 100 (HEMOSTASIS) ×2 IMPLANT
SPONGE SURGIFOAM ABS GEL SZ50 (HEMOSTASIS) ×3 IMPLANT
STAPLER VISISTAT 35W (STAPLE) IMPLANT
STRIP CLOSURE SKIN 1/2X4 (GAUZE/BANDAGES/DRESSINGS) ×2 IMPLANT
SUT VIC AB 0 CT1 18XCR BRD8 (SUTURE) ×1 IMPLANT
SUT VIC AB 0 CT1 8-18 (SUTURE) ×3
SUT VIC AB 2-0 CT1 18 (SUTURE) ×3 IMPLANT
SUT VIC AB 3-0 SH 8-18 (SUTURE) ×3 IMPLANT
TAPE CLOTH 4X10 WHT NS (GAUZE/BANDAGES/DRESSINGS) ×3 IMPLANT
TOWEL GREEN STERILE (TOWEL DISPOSABLE) ×3 IMPLANT
TOWEL GREEN STERILE FF (TOWEL DISPOSABLE) ×3 IMPLANT
WATER STERILE IRR 1000ML POUR (IV SOLUTION) ×3 IMPLANT

## 2021-01-03 NOTE — Anesthesia Postprocedure Evaluation (Signed)
Anesthesia Post Note  Patient: ANAY WALTER  Procedure(s) Performed: Posterior cervical fusion with lateral mass fixation - Cervical four-Cervical five - Cervical five-Cervical six (Spine Cervical)     Patient location during evaluation: PACU Anesthesia Type: General Level of consciousness: awake and alert Pain management: pain level controlled Vital Signs Assessment: post-procedure vital signs reviewed and stable Respiratory status: spontaneous breathing, nonlabored ventilation, respiratory function stable and patient connected to nasal cannula oxygen Cardiovascular status: blood pressure returned to baseline and stable Postop Assessment: no apparent nausea or vomiting Anesthetic complications: no   No notable events documented.  Last Vitals:  Vitals:   01/03/21 1139 01/03/21 1644  BP: 139/73 (!) 164/85  Pulse: 88 90  Resp: 20 18  Temp: 36.4 C 36.9 C  SpO2: 96% 100%    Last Pain:  Vitals:   01/03/21 1756  TempSrc:   PainSc: Asleep                 Larysa Pall COKER

## 2021-01-03 NOTE — Anesthesia Procedure Notes (Signed)
Procedure Name: Intubation Date/Time: 01/03/2021 8:28 AM Performed by: Noel Christmas, RN Pre-anesthesia Checklist: Patient identified, Emergency Drugs available, Suction available and Patient being monitored Patient Re-evaluated:Patient Re-evaluated prior to induction Oxygen Delivery Method: Circle System Utilized Preoxygenation: Pre-oxygenation with 100% oxygen Induction Type: IV induction Ventilation: Mask ventilation without difficulty and Oral airway inserted - appropriate to patient size Laryngoscope Size: Glidescope and 3 Grade View: Grade I Tube type: Oral Tube size: 7.0 mm Number of attempts: 1 Airway Equipment and Method: Stylet Placement Confirmation: ETT inserted through vocal cords under direct vision, positive ETCO2 and breath sounds checked- equal and bilateral Secured at: 21 cm Tube secured with: Tape Dental Injury: Teeth and Oropharynx as per pre-operative assessment

## 2021-01-03 NOTE — H&P (Signed)
Susan [name] is an 65 y.o. female.   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  Patient presents after many years with history of increasing neck pain after an accident.  Patient reports near constant neck pain with radiating pain and numbness extending into both upper extremities.  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  Fusion appears solid at C3-4.  Patient presents now for posterior cervical fusion in hopes of improving her symptoms.  Past Medical History:  Diagnosis Date   Anxiety    Arthritis    GERD (gastroesophageal reflux disease)    Headache    Hypertension    Pneumonia 2021   Pre-diabetes     Past Surgical History:  Procedure Laterality Date   ABDOMINAL HYSTERECTOMY     BACK SURGERY     CERVICAL FUSION     HEMORROIDECTOMY     Lymph Nodes Removed from Right Axilla     Between 80s-90s    History reviewed. No pertinent family history. Social History:  reports that she has been smoking cigarettes. She has been smoking an average of 1.00 packs per day. Her smokeless tobacco use includes snuff. She reports current alcohol use. She reports that she does not use drugs.  Allergies:  Allergies  Allergen Reactions   Hydrocodone-Acetaminophen     GI Upset (intolerance)   Azithromycin Rash    hyperactivity    Tramadol Itching and Nausea Only    Medications Prior to Admission  Medication Sig Dispense Refill   acetaminophen (TYLENOL) 500 MG tablet Take 1,500 mg by mouth every 8 (eight) hours as needed for moderate pain or headache.     atorvastatin (LIPITOR) 80 MG tablet Take 80 mg by mouth daily.     fexofenadine (ALLEGRA) 180 MG tablet Take 180 mg by mouth daily as needed for allergies or rhinitis.     gabapentin (NEURONTIN) 600 MG tablet Take 600 mg by mouth 3 (three) times daily.     lisinopril  (ZESTRIL) 20 MG tablet Take 20 mg by mouth daily.     meloxicam (MOBIC) 15 MG tablet Take 15 mg by mouth daily.     Naphazoline HCl (CLEAR EYES OP) Place 1 drop into both eyes daily as needed (redness).     OVER THE COUNTER MEDICATION Take 1 tablet by mouth daily. Diaplex otc supplement     OVER THE COUNTER MEDICATION Take 2 capsules by mouth daily. Albaplex otc supplement     sertraline (ZOLOFT) 50 MG tablet Take 50 mg by mouth daily.     temazepam (RESTORIL) 30 MG capsule Take 30 mg by mouth at bedtime as needed for sleep.     topiramate (TOPAMAX) 50 MG tablet Take 50 mg by mouth 2 (two) times daily.     vitamin B-12 (CYANOCOBALAMIN) 1000 MCG tablet Take 1,000 mcg by mouth daily.     tiZANidine (ZANAFLEX) 4 MG tablet Take 4 mg by mouth 3 (three) times daily as needed for muscle spasms.      Results for orders placed or performed during the hospital encounter of 01/03/21 (from the past 48 hour(s))  Glucose, capillary     Status: Abnormal   Collection Time: 01/03/21  6:24 AM  Result Value Ref Range   Glucose-Capillary 104 (H) 70 - 99 mg/dL    Comment: Glucose reference range applies only to samples taken after fasting for at least 8  hours.   No results found.  Pertinent items noted in HPI and remainder of comprehensive ROS otherwise negative.  Blood pressure 139/69, pulse 69, temperature 98.2 F (36.8 C), temperature source Oral, resp. rate 18, height 4\' 11"  (1.499 m), weight 97.1 kg, SpO2 98 %.  Patient is awake and alert.  She is oriented and appropriate.  Speech is fluent.  Judgment insight are intact.  Cranial nerve function intact.  Motor examination 5/5 bilateral.  Sensory examination some patchy distal sensory loss in both upper extremities.  Reflexes are normal active.  No evidence of long track signs.  Gait antalgic.  Posture examination head ears eyes nose throat is unremarked.  Chest and abdomen are benign.  Extremities are free from injury deformity.    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  Plan C4-5-6 posterior cervical fusion utilizing lateral mass instrumentation, local autograft, morselized allograft, and bone neurogenic protein.  Risks and benefits been explained.  Patient wishes to proceed.  A Kahne Helfand 01/03/2021, 7:56 AM

## 2021-01-03 NOTE — Op Note (Signed)
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  Surgeon:Zanyla Klebba A.Ezrah Dembeck, M.D.  Asst. Surgeon: Doran Durand, NP  Anesthesia: General  Indication: 65 year old female remotely status post anterior cervical decompression and fusion for treatment of compressive cervical myelopathy.  Patient presents now after an accident with increased neck pain.  Work-up demonstrates evidence of pseudoarthrosis with instability at C4-5 and C5-6.  Fusion is solid at C3-4.  Patient presents now for posterior cervical fusion in hopes of improving her symptoms.  Operative note: After induction of anesthesia, patient position prone onto bolsters with her head fixed in [place] pin headrest.  Patient's posterior cervical region prepped draped sterilely.  Incision made overlying C5.  Dissection performed bilaterally.  Retractor placed.  Fluoroscopy used.  Levels confirmed.  Articular masses of C4, C5 and C6 were inspected as were the facet joints.  Entry sites were determined.  Holes were then made and the articular mass approximately 1 mm inferior and medially from the midpoint of the articular mass.  Pilot holes were then drilled approximately 20 degrees cephalad and lateral.  Pilot holes were probed and found to be solidly within bone.  14 mm screws were placed bilaterally at C4-C5 and C6.  Fluoroscopy revealed good position of the screws of the proper operative level.  Short segment titanium rod was then placed through the screw heads from C4-C6.  Locking caps were then placed through the screw.  Locking caps were then engaged with construct under mild compression.  Facet joints articular mass and lamina were then  decorticated.  Morselized autograft was saved and placed out laterally.  Small bone morphogenic protein soaked sponges were placed laterally.  Magnifuse allograft packets were then packed laterally.  Vancomycin placed in the deep wound space.  Final images reveal good position of the hardware at the proper operative level with normal alignment of spine.  Wounds and closed in layers with Vicryl sutures.  Steri-Strips and sterile dressing were applied.  No apparent complications.  Patient tolerated the procedure well and she returns to the recovery room postop.

## 2021-01-03 NOTE — Anesthesia Preprocedure Evaluation (Signed)
Anesthesia Evaluation  Patient identified by MRN, date of birth, ID band Patient awake    Reviewed: Allergy & Precautions, NPO status , Patient's Chart, lab work & pertinent test results  Airway Mallampati: III  TM Distance: >3 FB Neck ROM: Limited    Dental  (+) Teeth Intact, Dental Advisory Given   Pulmonary Current Smoker,    breath sounds clear to auscultation       Cardiovascular hypertension,  Rhythm:Regular Rate:Normal     Neuro/Psych    GI/Hepatic   Endo/Other    Renal/GU      Musculoskeletal   Abdominal   Peds  Hematology   Anesthesia Other Findings   Reproductive/Obstetrics                             Anesthesia Physical Anesthesia Plan  ASA: 3  Anesthesia Plan: General   Post-op Pain Management:    Induction: Intravenous  PONV Risk Score and Plan: 1 and Ondansetron and Dexamethasone  Airway Management Planned: Oral ETT  Additional Equipment:   Intra-op Plan:   Post-operative Plan: Extubation in OR  Informed Consent: I have reviewed the patients History and Physical, chart, labs and discussed the procedure including the risks, benefits and alternatives for the proposed anesthesia with the patient or authorized representative who has indicated his/her understanding and acceptance.     Dental advisory given  Plan Discussed with: Anesthesiologist and CRNA  Anesthesia Plan Comments:         Anesthesia Quick Evaluation

## 2021-01-03 NOTE — Transfer of Care (Signed)
Immediate Anesthesia Transfer of Care Note  Patient: Susan [name]  Procedure(s) Performed: Posterior cervical fusion with lateral mass fixation - Cervical four-Cervical five - Cervical five-Cervical six (Spine Cervical)  Patient Location: PACU  Anesthesia Type:General  Level of Consciousness: drowsy and responds to stimulation  Airway & Oxygen Therapy: Patient Spontanous Breathing and Patient connected to face mask oxygen  Post-op Assessment: Report given to RN and Post -op Vital signs reviewed and stable  Post vital signs: Reviewed and stable  Last Vitals:  Vitals Value Taken Time  BP    Temp    Pulse 73 01/03/21 1002  Resp 16 01/03/21 1002  SpO2 100 % 01/03/21 1002  Vitals shown include unvalidated device data.  Last Pain:  Vitals:   01/03/21 0708  TempSrc:   PainSc: 3       Patients Stated Pain Goal: 2 (01/03/21 0708)  Complications: No notable events documented.

## 2021-01-03 NOTE — Brief Op Note (Signed)
01/03/2021  9:44 AM  PATIENT:  [name]  65 y.o. female  PRE-OPERATIVE DIAGNOSIS:  Pseudoarthrosis  POST-OPERATIVE DIAGNOSIS:  Pseudoarthrosis  PROCEDURE:  Procedure(s): Posterior cervical fusion with lateral mass fixation - Cervical four-Cervical five - Cervical five-Cervical six (N/A)  SURGEON:  Surgeon(s) and Role:    Julio Sicks, MD - Primary  PHYSICIAN ASSISTANT:   ASSISTANTSMarland Mcalpine   ANESTHESIA:   general  EBL:  75 cc   BLOOD ADMINISTERED:none  DRAINS: none   LOCAL MEDICATIONS USED:  MARCAINE     SPECIMEN:  No Specimen  DISPOSITION OF SPECIMEN:  N/A  COUNTS:  YES  TOURNIQUET:  * No tourniquets in log *  DICTATION: .Dragon Dictation  PLAN OF CARE: Admit to inpatient   PATIENT DISPOSITION:  PACU - hemodynamically stable.   Delay start of Pharmacological VTE agent (>24hrs) due to surgical blood loss or risk of bleeding: yes

## 2021-01-03 NOTE — Progress Notes (Signed)
Orthopedic Tech Progress Note Patient Details:  Susan [name] [birth year] [id number] Patient has collar Patient ID: [name], female   DOB: 30-Sep-1955, 65 y.o.   MRN: [id number]  Michelle Piper 01/03/2021, 10:30 PM

## 2021-01-03 NOTE — Progress Notes (Signed)
Orthopedic Tech Progress Note Patient Details:  Susan [name] 07-02-1956 [id number]   Ortho Devices Type of Ortho Device: Soft collar Ortho Device/Splint Interventions: Ordered   Post Interventions Patient Tolerated: Well  Docia Furl 01/03/2021, 10:21 AM

## 2021-01-04 MED ORDER — HYDROCODONE-ACETAMINOPHEN 5-325 MG PO TABS: 1.0000 | ORAL_TABLET | ORAL | 0 refills | Status: AC | PRN

## 2021-01-04 NOTE — Evaluation (Signed)
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  PMH includes arthritis and HTN.   Clinical Impression   Patient admitted for the diagnosis and procedure above.  PTA she lives alone, but will have assist from family and a neighbor as needed.  Barriers are listed below, but essentially she is at, or near, her baseline for all mobility and ADL completion form a sit/stand level.  Precautions reviewed, patient verbalizes understanding, and all questions answered.  Recommend follow as prescribed by MD.      Follow Up Recommendations  No OT follow up    Equipment Recommendations  None recommended by OT    Recommendations for Other Services       Precautions / Restrictions Precautions Precautions: Cervical Precaution Booklet Issued: Yes (comment) Required Braces or Orthoses: Cervical Brace Cervical Brace: Soft collar;For comfort Restrictions Weight Bearing Restrictions: No      Mobility Bed Mobility                 Patient Response: Cooperative  Transfers Overall transfer level: Modified independent                    Balance Overall balance assessment: Mild deficits observed, not formally tested                                         ADL either performed or assessed with clinical judgement   ADL Overall ADL's : At baseline                                             Vision Patient Visual Report: No change from baseline                  Pertinent Vitals/Pain Pain Assessment: Faces Faces Pain Scale: Hurts little more Pain Location: surgical site Pain Descriptors / Indicators: Tender Pain Intervention(s): Monitored during session     Hand Dominance Right   Extremity/Trunk Assessment Upper  Extremity Assessment Upper Extremity Assessment: Overall WFL for tasks assessed   Lower Extremity Assessment Lower Extremity Assessment: Overall WFL for tasks assessed   Cervical / Trunk Assessment Cervical / Trunk Assessment: Other exceptions Cervical / Trunk Exceptions: c spine surgery   Communication Communication Communication: No difficulties   Cognition Arousal/Alertness: Awake/alert Behavior During Therapy: WFL for tasks assessed/performed Overall Cognitive Status: Within Functional Limits for tasks assessed                                                      Home Living Family/patient expects to be discharged to:: Private residence Living Arrangements: Children Available Help at Discharge: Family;Friend(s);Available 24 hours/day Type of Home: House Home Access: Stairs to enter [employer] of Steps: 1   Home Layout: One level     Bathroom Shower/Tub: Producer, television/film/video: Standard     Home Equipment: None          Prior Functioning/Environment  Level of Independence: Independent                 OT Problem List: Pain      OT Treatment/Interventions:      OT Goals(Current goals can be found in the care plan section) Acute Rehab OT Goals Patient Stated Goal: return home OT Goal Formulation: With patient Time For Goal Achievement: 01/04/21 Potential to Achieve Goals: Good  OT Frequency:     Barriers to D/C:  None noted          Co-evaluation              AM-PAC OT "6 Clicks" Daily Activity     Outcome Measure Help from another person eating meals?: None Help from another person taking care of personal grooming?: None Help from another person toileting, which includes using toliet, bedpan, or urinal?: None Help from another person bathing (including washing, rinsing, drying)?: None Help from another person to put on and taking off regular upper body clothing?: None Help from another person  to put on and taking off regular lower body clothing?: A Little 6 Click Score: 23   End of Session Equipment Utilized During Treatment: Cervical collar Nurse Communication: Mobility status  Activity Tolerance: Patient tolerated treatment well Patient left: in bed;with call bell/phone within reach;with family/visitor present  OT Visit Diagnosis: Pain                Time: 4287-6811 OT Time Calculation (min): 24 min Charges:  OT General Charges $OT Visit: 1 Visit OT Evaluation $OT Eval Moderate Complexity: 1 Mod OT Treatments $Self Care/Home Management : 8-22 mins  01/04/2021  Rich, OTR/L  Acute Rehabilitation Services  Office:  [phone]   Suzanna Obey 01/04/2021, 9:23 AM

## 2021-01-04 NOTE — Discharge Summary (Signed)
Discharge Summary  Date of Admission: 01/03/2021  Date of Discharge: 01/04/21  Attending Physician: Julio Sicks, MD  Hospital Course: Patient was admitted following an uncomplicated 2 level ACDF. She was recovered in PACU and transferred to [facility] Her pain was improved immediately post-op, her hospital course was uncomplicated and the patient was discharged home on 01/04/21. She will follow up in clinic with Dr. Jordan Likes in clinic in 2 weeks.  Neurologic exam at discharge:  Strength 5/5 x4, SILTx4  Discharge diagnosis: Cervical pseudoarthrosis  Jadene Pierini, MD 01/04/21 7:46 AM

## 2021-01-04 NOTE — Progress Notes (Signed)
Neurosurgery Service Progress Note  Subjective: No acute events overnight, neck and arm pain significantly improved post-op, says she "feels like a new woman"  Objective: Vitals:   01/03/21 1644 01/03/21 2058 01/03/21 2345 01/04/21 0452  BP: (!) 164/85 123/65 119/70 (!) 151/88  Pulse: 90 90 88 93  Resp: 18 20 18 18   Temp: 98.5 F (36.9 C) 98.9 F (37.2 C) 98.3 F (36.8 C) 98 F (36.7 C)  TempSrc: Oral Oral Oral Oral  SpO2: 100% 93% 94% 100%  Weight:      Height:        Physical Exam: Strength 5/5 x4, SILTx4 Incision c/d/I, neck soft  Assessment & Plan: 65 y.o. woman s/p 2 level ACDF, recovering well.  -discharge home today  69  01/04/21 7:42 AM

## 2021-01-04 NOTE — Progress Notes (Signed)
Patient is discharged from room 3C07 at this time. Alert and in stable condition. IV site d/c'd and instructions read to patient and daughter with all questions answered and understanding verbalized. Left unit via wheelchair with all belongings at side.

## 2021-01-04 NOTE — Discharge Instructions (Signed)

## 2021-01-06 ENCOUNTER — Encounter (HOSPITAL_COMMUNITY): Payer: Self-pay | Admitting: Neurosurgery

## 2023-11-07 ENCOUNTER — Other Ambulatory Visit: Payer: Self-pay

## 2023-11-07 ENCOUNTER — Encounter (HOSPITAL_COMMUNITY): Payer: Self-pay | Admitting: *Deleted

## 2023-11-07 ENCOUNTER — Emergency Department (HOSPITAL_COMMUNITY)
Admission: EM | Admit: 2023-11-07 | Discharge: 2023-11-07 | Disposition: A | Discharge status: Home / Self Care | Attending: Emergency Medicine | Admitting: Emergency Medicine

## 2023-11-07 ENCOUNTER — Emergency Department (HOSPITAL_COMMUNITY)

## 2023-11-07 DIAGNOSIS — R519 Headache, unspecified: Secondary | ICD-10-CM | POA: Diagnosis present

## 2023-11-07 LAB — CBC WITH DIFFERENTIAL/PLATELET
Abs Immature Granulocytes: 0.02 10*3/uL (ref 0.00–0.07)
Basophils Absolute: 0.1 10*3/uL (ref 0.0–0.1)
Basophils Relative: 1 %
Eosinophils Absolute: 0.3 10*3/uL (ref 0.0–0.5)
Eosinophils Relative: 5 %
HCT: 39.7 % (ref 36.0–46.0)
Hemoglobin: 12.7 g/dL (ref 12.0–15.0)
Immature Granulocytes: 0 %
Lymphocytes Relative: 38 %
Lymphs Abs: 2.2 10*3/uL (ref 0.7–4.0)
MCH: 30.7 pg (ref 26.0–34.0)
MCHC: 32 g/dL (ref 30.0–36.0)
MCV: 95.9 fL (ref 80.0–100.0)
Monocytes Absolute: 0.4 10*3/uL (ref 0.1–1.0)
Monocytes Relative: 7 %
Neutro Abs: 2.8 10*3/uL (ref 1.7–7.7)
Neutrophils Relative %: 49 %
Platelets: 222 10*3/uL (ref 150–400)
RBC: 4.14 MIL/uL (ref 3.87–5.11)
RDW: 15.2 % (ref 11.5–15.5)
WBC: 5.7 10*3/uL (ref 4.0–10.5)
nRBC: 0 % (ref 0.0–0.2)

## 2023-11-07 LAB — COMPREHENSIVE METABOLIC PANEL WITH GFR
ALT: 23 U/L (ref 0–44)
AST: 27 U/L (ref 15–41)
Albumin: 3.2 g/dL — ABNORMAL LOW (ref 3.5–5.0)
Alkaline Phosphatase: 75 U/L (ref 38–126)
Anion gap: 6 (ref 5–15)
BUN: 12 mg/dL (ref 8–23)
CO2: 23 mmol/L (ref 22–32)
Calcium: 8.4 mg/dL — ABNORMAL LOW (ref 8.9–10.3)
Chloride: 110 mmol/L (ref 98–111)
Creatinine, Ser: 0.88 mg/dL (ref 0.44–1.00)
GFR, Estimated: >60 mL/min (ref >60)
Glucose, Bld: 96 mg/dL (ref 70–99)
Potassium: 3.8 mmol/L (ref 3.5–5.1)
Sodium: 139 mmol/L (ref 135–145)
Total Bilirubin: 0.6 mg/dL (ref 0.0–1.2)
Total Protein: 6.1 g/dL — ABNORMAL LOW (ref 6.5–8.1)

## 2023-11-07 LAB — SEDIMENTATION RATE: Sed Rate: 21 mm/h (ref 0–22)

## 2023-11-07 MED ORDER — METOCLOPRAMIDE HCL 5 MG/ML IJ SOLN
10.0000 mg | Freq: Once | INTRAMUSCULAR | Status: AC
  Administered 2023-11-07: 10 mg via INTRAVENOUS
  Filled 2023-11-07: qty 2

## 2023-11-07 MED ORDER — KETOROLAC TROMETHAMINE 15 MG/ML IJ SOLN
15.0000 mg | Freq: Once | INTRAMUSCULAR | Status: AC
  Administered 2023-11-07: 15 mg via INTRAVENOUS
  Filled 2023-11-07: qty 1

## 2023-11-07 NOTE — ED Triage Notes (Signed)
 The pt has had a headache  for 3-4 days no known injury  she called her doctor who has seen her already  and she was told that she could be seen tomorrow in the office but she came here

## 2023-11-07 NOTE — ED Provider Notes (Signed)
 Patient presented the ED for evaluation of a headache.  She was initially seen by Dr. Lewis Red.  Please see his note.  Patient's ED workup is reassuring.  Sed rate is normal.  Head CT is normal.  Evaluation and diagnostic testing in the emergency department does not suggest an emergent condition requiring admission or immediate intervention beyond what has been performed at this time.  The patient is safe for discharge and has been instructed to return immediately for worsening symptoms, change in symptoms or any other concerns.   Trish Furl, MD 11/07/23 2320

## 2023-11-07 NOTE — ED Provider Notes (Signed)
 Susan [name] Provider Note   CSN: [id number] Arrival date & time: 11/07/23  1508     History {Add pertinent medical, surgical, social history, OB history to HPI:1} Chief Complaint  Patient presents with   Headache    Susan [name] is a 68 y.o. female.  HPI    See medical decision/plan below Home Medications Prior to Admission medications   Medication Sig Start Date End Date Taking? Authorizing Provider  atorvastatin  (LIPITOR) 80 MG tablet Take 80 mg by mouth daily.    [provider]  fexofenadine (ALLEGRA) 180 MG tablet Take 180 mg by mouth daily as needed for allergies or rhinitis.    [provider]  gabapentin  (NEURONTIN ) 600 MG tablet Take 600 mg by mouth 3 (three) times daily.    [provider]  HYDROcodone -acetaminophen  (NORCO/VICODIN) 5-325 MG tablet Take 1 tablet by mouth every 4 (four) hours as needed for moderate pain ((score 4 to 6)). 01/04/21   Cannon Champion, MD  lisinopril  (ZESTRIL ) 20 MG tablet Take 20 mg by mouth daily.    [provider]  meloxicam  (MOBIC ) 15 MG tablet Take 15 mg by mouth daily.    [provider]  Naphazoline HCl (CLEAR EYES OP) Place 1 drop into both eyes daily as needed (redness).    [provider]  OVER THE COUNTER MEDICATION Take 1 tablet by mouth daily. Diaplex otc supplement    [provider]  OVER THE COUNTER MEDICATION Take 2 capsules by mouth daily. Albaplex otc supplement    [provider]  sertraline  (ZOLOFT ) 50 MG tablet Take 50 mg by mouth daily.    [provider]  temazepam  (RESTORIL ) 30 MG capsule Take 30 mg by mouth at bedtime as needed for sleep.    [provider]  tiZANidine  (ZANAFLEX ) 4 MG tablet Take 4 mg by mouth 3 (three) times daily as needed for muscle spasms.    [provider]  topiramate  (TOPAMAX ) 50 MG tablet Take 50 mg by mouth 2 (two) times daily.    [provider]  vitamin B-12 (CYANOCOBALAMIN ) 1000 MCG tablet Take 1,000 mcg by mouth daily.    [provider]      Allergies    Hydrocodone -acetaminophen , Azithromycin, and Tramadol    Review of Systems   Review of Systems  Physical Exam Updated Vital Signs BP (!) 141/79 (BP Location: Left Arm)   Pulse 89   Temp 99 F (37.2 C) (Oral)   Resp 17   Ht 4\' 11"  (1.499 m)   Wt 97.1 kg   SpO2 100%   BMI 43.24 kg/m  Physical Exam  ED Results / Procedures / Treatments   Labs (all labs ordered are listed, but only abnormal results are displayed) Labs Reviewed  CBC WITH DIFFERENTIAL/PLATELET  COMPREHENSIVE METABOLIC PANEL WITH GFR  SEDIMENTATION RATE    EKG None  Radiology CT Head Wo Contrast Result Date: 11/07/2023 CLINICAL DATA:  Headache, increasing frequency or severity EXAM: CT HEAD WITHOUT CONTRAST TECHNIQUE: Contiguous axial images were obtained from the base of the skull through the vertex without intravenous contrast. RADIATION DOSE REDUCTION: This exam was performed according to the departmental dose-optimization program which includes automated exposure control, adjustment of the mA and/or kV according to patient size and/or use of iterative reconstruction technique. COMPARISON:  None Available. FINDINGS: Brain: No evidence of acute infarction, hemorrhage, hydrocephalus, extra-axial collection or mass lesion/mass effect. Vascular: No hyperdense vessel. Skull: No acute  fracture. Sinuses/Orbits: Clear sinuses.  No acute orbital findings. Other: No mastoid effusions. IMPRESSION: No evidence of acute intracranial abnormality. Electronically Signed   By: Stevenson Elbe M.D.   On: 11/07/2023 19:28    Procedures Procedures  {Document cardiac monitor, telemetry assessment procedure when appropriate:1}  Medications Ordered in ED Medications  metoCLOPramide  (REGLAN ) injection 10 mg (has no administration in time range)  ketorolac  (TORADOL ) 15 MG/ML injection 15 mg  (has no administration in time range)    ED Course/ Medical Decision Making/ A&P   {   Click here for ABCD2, HEART and other calculatorsREFRESH Note before signing :1}                              Medical Decision Making Amount and/or Complexity of Data Reviewed Labs: ordered.  Risk Prescription drug management.   68 year old patient comes in with chief complaint of headache.  Headache is left-sided, it is new.  Patient has no associated neurodeficits.  Differential considered for this patient includes trigeminal neuralgia, temporal arteritis, musculoskeletal headaches, brain tumor. Low suspicion for vascular etiology such as subarachnoid hemorrhage, venous dural thrombosis.  Pain has only been present for 2 days.  CT scan of the head ordered and independently interpreted, it is negative.  Plan is to get basic labs including sed rate.  Given that she has focal left-sided headache, reproducible with palpation of the temple region, we will get sed rate.  If the sed rate is elevated, then we will need to consult ophthalmology and make sure patient gets close follow-up and started on prednisone.  If sed rate is normal then she can be discharged with outpatient neurology follow-up.  Final Clinical Impression(s) / ED Diagnoses Final diagnoses:  None    Rx / DC Orders ED Discharge Orders     None

## 2023-11-07 NOTE — ED Provider Triage Note (Signed)
 Emergency Medicine Provider Triage Evaluation Note  Susan [name] , [name] 68 y.o. female  was evaluated in triage.  Pt complains of headache.  Patient reports 3 to 4 days of persistent left-sided headache with pain in her left neck.  She denies any significant history for headaches or migraines and feels that this is atypical in nature.  She has tried over-the-counter medications at home without improvement in symptoms.  Does endorse some mild light sensitivity but denies any photophobia.  Review of Systems  Positive: As above Negative: As above  Physical Exam  BP (!) 141/79 (BP Location: Left Arm)   Pulse 89   Temp 99 F (37.2 C) (Oral)   Resp 17   Ht 4\' 11"  (1.499 m)   Wt 97.1 kg   SpO2 100%   BMI 43.24 kg/m  Gen:   Awake, no distress   Resp:  Normal effort  MSK:   Moves extremities without difficulty  Other:    Medical Decision Making  Medically screening exam initiated at 4:12 PM.  Appropriate orders placed.  Susan [name] was informed that the remainder of the evaluation will be completed by another provider, this initial triage assessment does not replace that evaluation, and the importance of remaining in the ED until their evaluation is complete.     Susan Schnoor A, PA-C 11/10/23 1228

## 2023-11-07 NOTE — Discharge Instructions (Addendum)
 The blood tests and x-rays did not show any signs of serious abnormality.  Follow-up with your primary care doctor for further evaluation of the symptoms persist
